# Patient Record
Sex: Male | Born: 1990 | Race: Black or African American | Hispanic: No | Marital: Single | Smoking: Never smoker
Health system: Southern US, Community
[De-identification: ages and names within clinical notes are randomized; demographics above are authoritative.]

---

## 2016-04-19 ENCOUNTER — Emergency Department (HOSPITAL_COMMUNITY): Payer: Self-pay

## 2016-04-19 ENCOUNTER — Encounter (HOSPITAL_COMMUNITY): Payer: Self-pay | Admitting: Emergency Medicine

## 2016-04-19 ENCOUNTER — Emergency Department (HOSPITAL_COMMUNITY)
Admission: EM | Admit: 2016-04-19 | Discharge: 2016-04-19 | Disposition: A | Discharge status: Home / Self Care | Payer: Self-pay | Attending: Emergency Medicine | Admitting: Emergency Medicine

## 2016-04-19 DIAGNOSIS — Y939 Activity, unspecified: Secondary | ICD-10-CM | POA: Insufficient documentation

## 2016-04-19 DIAGNOSIS — Y929 Unspecified place or not applicable: Secondary | ICD-10-CM | POA: Insufficient documentation

## 2016-04-19 DIAGNOSIS — Z79899 Other long term (current) drug therapy: Secondary | ICD-10-CM | POA: Insufficient documentation

## 2016-04-19 DIAGNOSIS — Y990 Civilian activity done for income or pay: Secondary | ICD-10-CM | POA: Insufficient documentation

## 2016-04-19 DIAGNOSIS — S29012A Strain of muscle and tendon of back wall of thorax, initial encounter: Secondary | ICD-10-CM | POA: Insufficient documentation

## 2016-04-19 DIAGNOSIS — X58XXXA Exposure to other specified factors, initial encounter: Secondary | ICD-10-CM | POA: Insufficient documentation

## 2016-04-19 MED ORDER — METHOCARBAMOL 500 MG PO TABS: 500.0000 mg | ORAL_TABLET | Freq: Two times a day (BID) | ORAL | 0 refills | Status: AC

## 2016-04-19 MED ORDER — IBUPROFEN 800 MG PO TABS: 800.0000 mg | ORAL_TABLET | Freq: Three times a day (TID) | ORAL | 0 refills | Status: AC | PRN

## 2016-04-19 MED ORDER — LIDOCAINE 5 % EX PTCH: 1.0000 | MEDICATED_PATCH | CUTANEOUS | 0 refills | Status: AC

## 2016-04-19 NOTE — ED Triage Notes (Signed)
Pt reports he began to have R upper flank/lower rib pain after reaching for something yesterday. No urinary symptoms. Pain worsens with movement.

## 2016-04-19 NOTE — Discharge Instructions (Signed)
Read the information below.  Use the prescribed medication as directed.  Please discuss all new medications with your pharmacist.  You may return to the Emergency Department at any time for worsening condition or any new symptoms that concern you.    °

## 2016-04-19 NOTE — ED Provider Notes (Signed)
[facility]-EMERGENCY DEPT Provider Note   CSN: [id number] Arrival date & time: 04/19/16  1717  By signing my name below, I, Rosario Adie, attest that this documentation has been prepared under the direction and in the presence of [facility], PA-C.  Electronically Signed: Rosario Adie, ED Scribe. 04/19/16. 6:41 PM.  History   Chief Complaint Chief Complaint  Patient presents with  . Flank Pain   The history is provided by the patient. No language interpreter was used.    HPI Comments: Jose Suarez is a 26 y.o. male with no pertinent PMHx, who presents to the Emergency Department complaining of sudden onset, persistent right flank pain beginning yesterday. No radiation of pain. Per pt, he was at work reaching upward yesterday when he had a sudden pain to his right flank area. His pain is mildly exacerbated with deep inspirations and movement of the right arm upwards. He has applied ice to the area without relief of his pain. He denies cough, fever, chills, congestion, shortness of breath, chest pain, abdominal pain, dysuria, hematuria, urgency, frequency, weakness, numbness, or any other associated symptoms.   History reviewed. No pertinent past medical history.  There are no active problems to display for this patient.  History reviewed. No pertinent surgical history.  Home Medications    Prior to Admission medications   Medication Sig Start Date End Date Taking? Authorizing Provider  ibuprofen (ADVIL,MOTRIN) 800 MG tablet Take 1 tablet (800 mg total) by mouth every 8 (eight) hours as needed for mild pain or moderate pain. 04/19/16   Trixie Dredge, PA-C  lidocaine (LIDODERM) 5 % Place 1 patch onto the skin daily. Remove & Discard patch within 12 hours or as directed by MD 04/19/16   Trixie Dredge, PA-C  methocarbamol (ROBAXIN) 500 MG tablet Take 1 tablet (500 mg total) by mouth 2 (two) times daily. 04/19/16   Trixie Dredge, PA-C   Family History History reviewed. No pertinent family  history.  Social History Social History  Substance Use Topics  . Smoking status: Never Smoker  . Smokeless tobacco: Never Used  . Alcohol use No   Allergies   Patient has no known allergies.  Review of Systems Review of Systems  Constitutional: Negative for chills and fever.  HENT: Negative for congestion.   Respiratory: Negative for cough and shortness of breath.   Cardiovascular: Negative for chest pain.  Gastrointestinal: Negative for abdominal pain.  Genitourinary: Positive for flank pain. Negative for dysuria, frequency, hematuria and urgency.  Musculoskeletal: Positive for myalgias.  Neurological: Negative for weakness and numbness.   Physical Exam Updated Vital Signs BP 123/81 (BP Location: Left Arm)   Pulse 70   Temp 98.1 F (36.7 C) (Oral)   Resp 20   SpO2 99%   Physical Exam  Constitutional: He appears well-developed and well-nourished.  HENT:  Head: Normocephalic and atraumatic.  Neck: Neck supple.  Pulmonary/Chest: Effort normal.  Musculoskeletal: Normal range of motion. He exhibits tenderness.  Point tenderness over the right lateral thoracic back. No skin changes. Spine nontender, no crepitus, or stepoffs.  Neurological: He is alert.  Nursing note and vitals reviewed.  ED Treatments / Results  DIAGNOSTIC STUDIES: Oxygen Saturation is 99% on RA, normal by my interpretation.   COORDINATION OF CARE: 6:41 PM-Discussed next steps with pt. Pt verbalized understanding and is agreeable with the plan.   Radiology Dg Ribs Unilateral W/chest Right  Result Date: 04/19/2016 CLINICAL DATA:  Right upper flank and lower rib pain EXAM: RIGHT RIBS AND  CHEST - 3+ VIEW COMPARISON:  None. FINDINGS: No fracture or other bone lesions are seen involving the ribs. There is no evidence of pneumothorax or pleural effusion. Both lungs are clear. Heart size and mediastinal contours are within normal limits. IMPRESSION: Clear lungs.  No acute osseous abnormality. Electronically  Signed   By: Tollie Eth M.D.   On: 04/19/2016 18:49    Procedures Procedures   Medications Ordered in ED Medications - No data to display  Initial Impression / Assessment and Plan / ED Course  I have reviewed the triage vital signs and the nursing notes.  Pertinent labs & imaging results that were available during my care of the patient were reviewed by me and considered in my medical decision making (see chart for details).     Afebrile, nontoxic patient with point tenderness over his right thoracic back after reaching for something at work.  Suspect muscle strain.  No rib fracture on xray.  No other symptoms concerning for other etiologies such as kidney stone/ureteral colic, pna, PE.   D/C home with symptomatic medications.  Discussed result, findings, treatment, and follow up  with patient.  Pt given return precautions.  Pt verbalizes understanding and agrees with plan.       Final Clinical Impressions(s) / ED Diagnoses   Final diagnoses:  Muscle strain of right upper back, initial encounter   New Prescriptions New Prescriptions   IBUPROFEN (ADVIL,MOTRIN) 800 MG TABLET    Take 1 tablet (800 mg total) by mouth every 8 (eight) hours as needed for mild pain or moderate pain.   LIDOCAINE (LIDODERM) 5 %    Place 1 patch onto the skin daily. Remove & Discard patch within 12 hours or as directed by MD   METHOCARBAMOL (ROBAXIN) 500 MG TABLET    Take 1 tablet (500 mg total) by mouth 2 (two) times daily.   I personally performed the services described in this documentation, which was scribed in my presence. The recorded information has been reviewed and is accurate.     Trixie Dredge, PA-C 04/19/16 2047    Doug Sou, MD 04/20/16 [phone]

## 2017-09-22 IMAGING — CR DG RIBS W/ CHEST 3+V*R*
5 series · 5 of 5 positions shown · non-contrast
Comparison: None.

CLINICAL DATA: Right upper flank and lower rib pain

EXAM:
RIGHT RIBS AND CHEST - 3+ VIEW

[w chest pa]
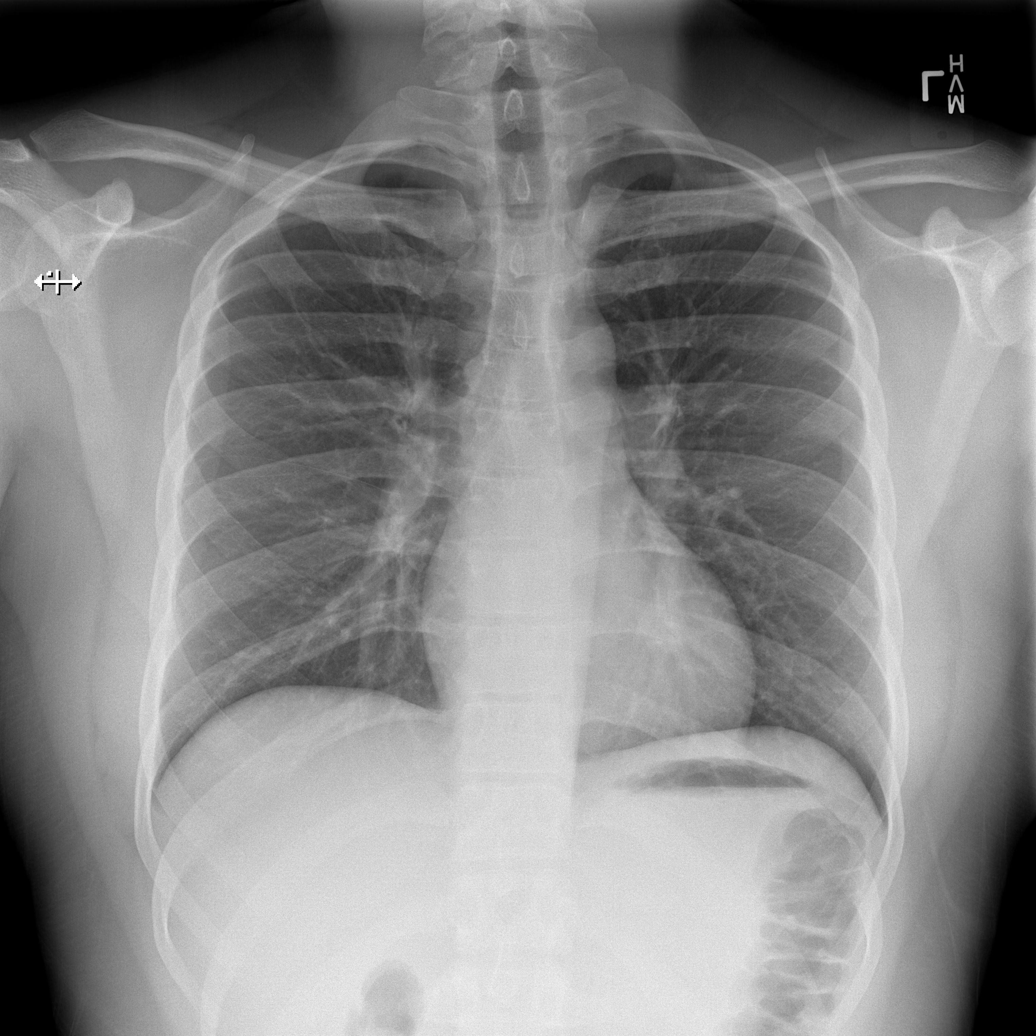

[w ribs ap upper right]
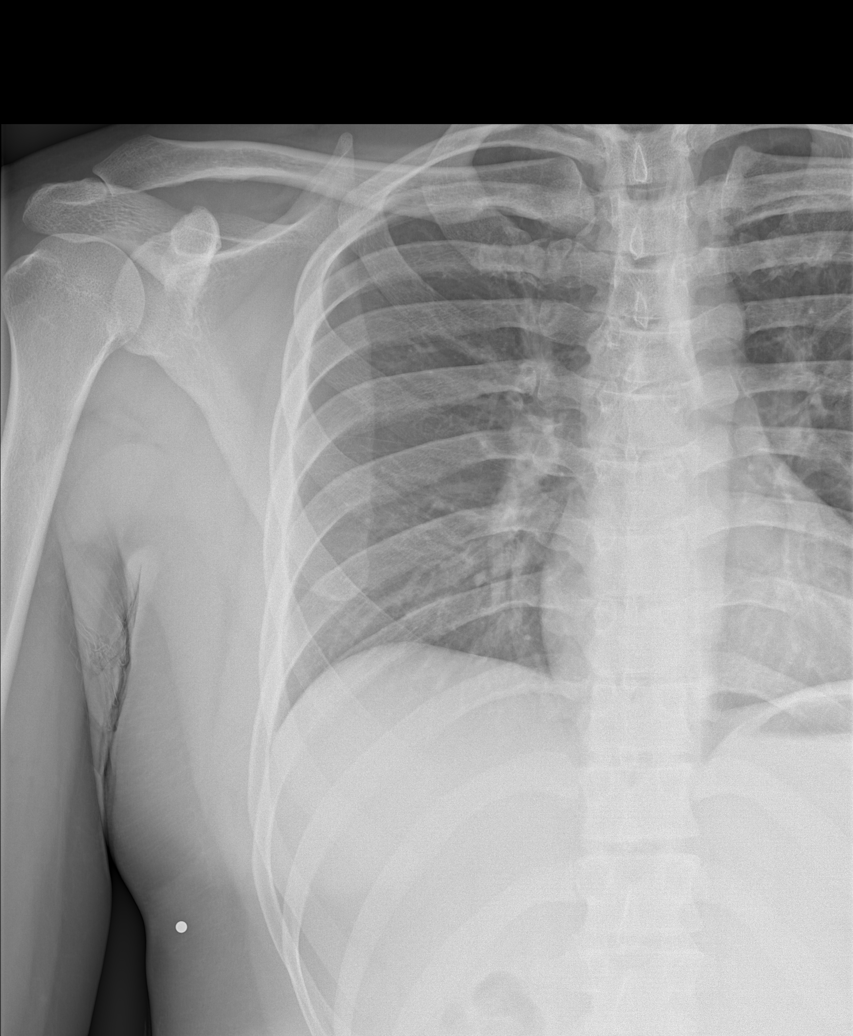

[w ribs ap lower right]
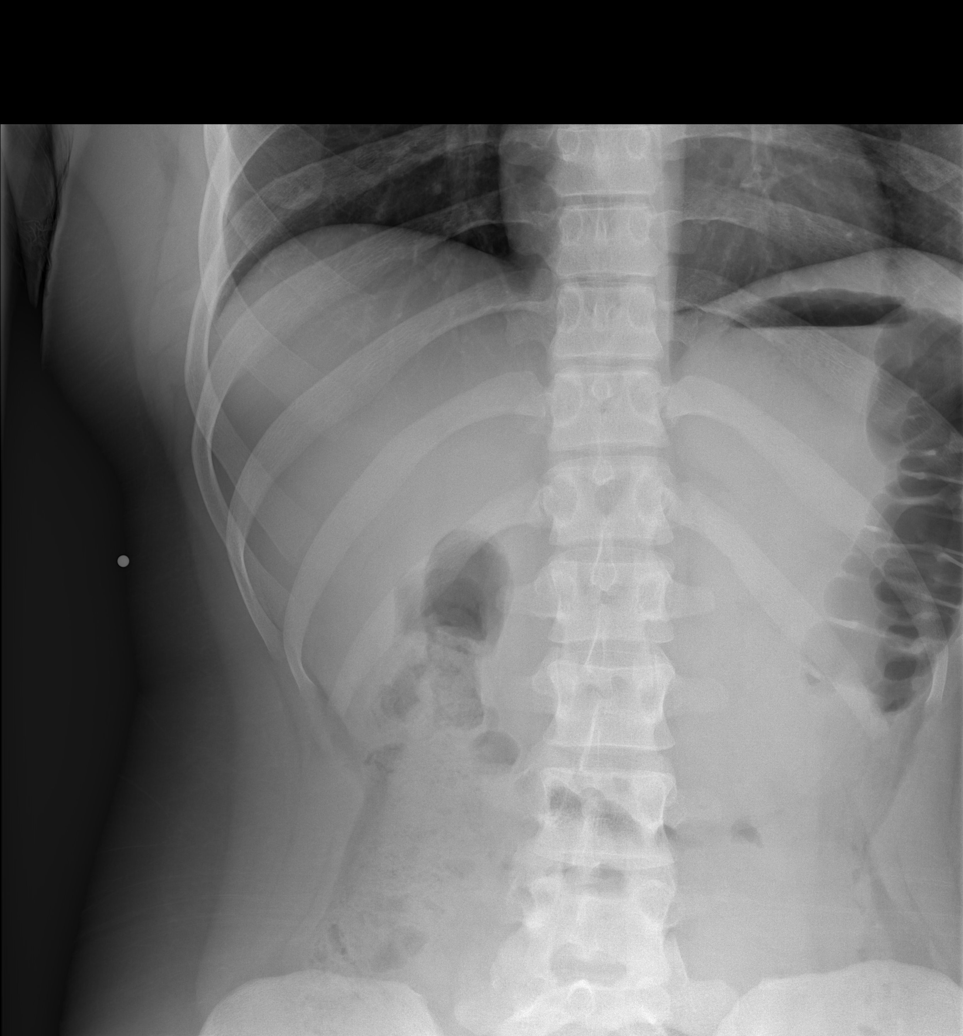

[w ribs obl right (1 of 2)]
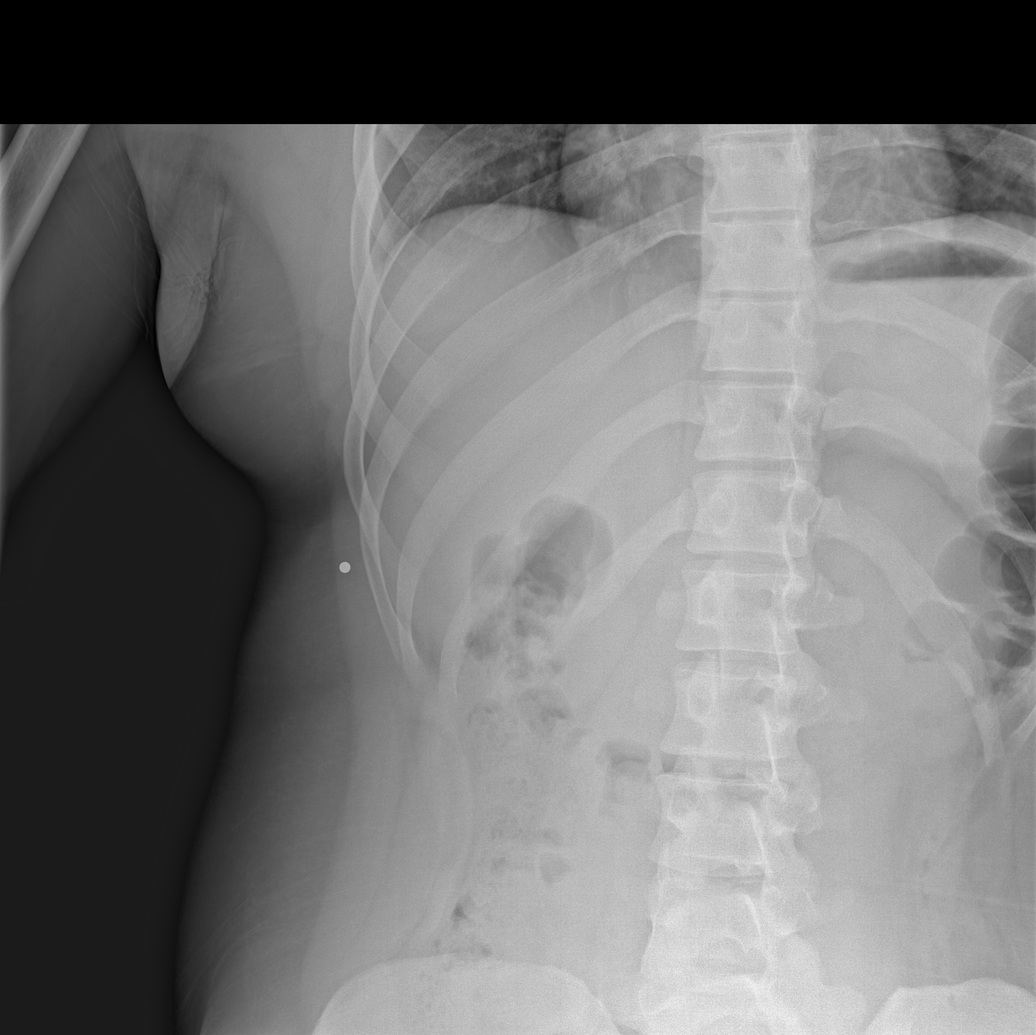

[w ribs obl right (2 of 2)]
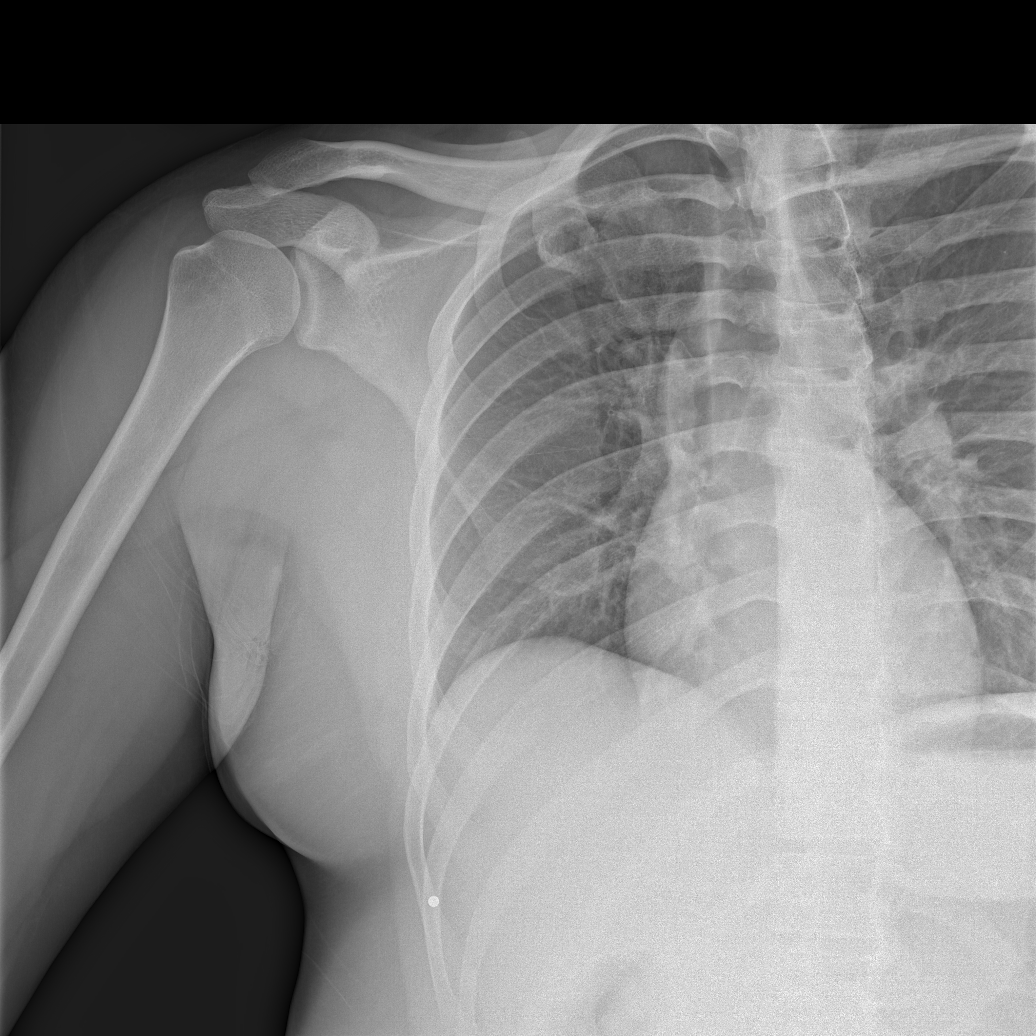

[5 of 5 positions shown; findings below may reference images not displayed]

FINDINGS: No fracture or other bone lesions are seen involving the ribs. There
is no evidence of pneumothorax or pleural effusion. Both lungs are
clear. Heart size and mediastinal contours are within normal limits.
IMPRESSION: Clear lungs.  No acute osseous abnormality.
